# Patient Record
Sex: Male | Born: 1945 | Race: White | Hispanic: No | Marital: Married | Smoking: Never smoker
Health system: Southern US, Community
[De-identification: ages and names within clinical notes are randomized; demographics above are authoritative.]

## PROBLEM LIST (undated history)

## (undated) DIAGNOSIS — B029 Zoster without complications: Secondary | ICD-10-CM

## (undated) DIAGNOSIS — H409 Unspecified glaucoma: Secondary | ICD-10-CM

## (undated) DIAGNOSIS — M419 Scoliosis, unspecified: Secondary | ICD-10-CM

## (undated) DIAGNOSIS — C61 Malignant neoplasm of prostate: Secondary | ICD-10-CM

## (undated) DIAGNOSIS — G43909 Migraine, unspecified, not intractable, without status migrainosus: Secondary | ICD-10-CM

## (undated) HISTORY — DX: Unspecified glaucoma: H40.9

## (undated) HISTORY — DX: Migraine, unspecified, not intractable, without status migrainosus: G43.909

## (undated) HISTORY — DX: Zoster without complications: B02.9

## (undated) HISTORY — DX: Malignant neoplasm of prostate: C61

## (undated) HISTORY — DX: Scoliosis, unspecified: M41.9

## (undated) HISTORY — PX: HERNIA REPAIR: SHX51

---

## 2015-06-14 DIAGNOSIS — M41125 Adolescent idiopathic scoliosis, thoracolumbar region: Secondary | ICD-10-CM | POA: Insufficient documentation

## 2015-06-14 DIAGNOSIS — M545 Low back pain, unspecified: Secondary | ICD-10-CM | POA: Insufficient documentation

## 2015-06-14 DIAGNOSIS — M4722 Other spondylosis with radiculopathy, cervical region: Secondary | ICD-10-CM | POA: Insufficient documentation

## 2015-06-14 DIAGNOSIS — R972 Elevated prostate specific antigen [PSA]: Secondary | ICD-10-CM | POA: Insufficient documentation

## 2015-06-14 DIAGNOSIS — G5622 Lesion of ulnar nerve, left upper limb: Secondary | ICD-10-CM | POA: Insufficient documentation

## 2015-06-14 DIAGNOSIS — B009 Herpesviral infection, unspecified: Secondary | ICD-10-CM | POA: Insufficient documentation

## 2015-06-14 DIAGNOSIS — G43009 Migraine without aura, not intractable, without status migrainosus: Secondary | ICD-10-CM | POA: Insufficient documentation

## 2015-06-14 DIAGNOSIS — G8929 Other chronic pain: Secondary | ICD-10-CM | POA: Insufficient documentation

## 2016-02-20 DIAGNOSIS — Z8042 Family history of malignant neoplasm of prostate: Secondary | ICD-10-CM | POA: Insufficient documentation

## 2016-06-11 DIAGNOSIS — C61 Malignant neoplasm of prostate: Secondary | ICD-10-CM | POA: Insufficient documentation

## 2016-06-11 DIAGNOSIS — N320 Bladder-neck obstruction: Secondary | ICD-10-CM | POA: Insufficient documentation

## 2017-07-21 DIAGNOSIS — H9313 Tinnitus, bilateral: Secondary | ICD-10-CM | POA: Insufficient documentation

## 2018-04-07 ENCOUNTER — Ambulatory Visit: Payer: Medicare PPO | Admitting: Urology

## 2018-04-07 ENCOUNTER — Encounter: Payer: Self-pay | Admitting: Urology

## 2018-04-07 VITALS — BP 155/94 | HR 66 | Ht 70.0 in | Wt 160.5 lb

## 2018-04-07 DIAGNOSIS — C61 Malignant neoplasm of prostate: Secondary | ICD-10-CM

## 2018-04-07 DIAGNOSIS — Z7689 Persons encountering health services in other specified circumstances: Secondary | ICD-10-CM | POA: Diagnosis not present

## 2018-04-07 LAB — URINALYSIS, COMPLETE
Bilirubin, UA: NEGATIVE
GLUCOSE, UA: NEGATIVE
KETONES UA: NEGATIVE
Nitrite, UA: NEGATIVE
PROTEIN UA: NEGATIVE
RBC, UA: NEGATIVE
Specific Gravity, UA: 1.025 (ref 1.005–1.030)
Urobilinogen, Ur: 0.2 mg/dL (ref 0.2–1.0)
pH, UA: 6 (ref 5.0–7.5)

## 2018-04-07 LAB — MICROSCOPIC EXAMINATION
Epithelial Cells (non renal): NONE SEEN /hpf (ref 0–10)
RBC, UA: NONE SEEN /hpf (ref 0–2)

## 2018-04-07 NOTE — Progress Notes (Signed)
04/07/2018 7:32 AM   Angel Perkins Jul 14, 1945 [id number]  Referring provider: No referring provider defined for this encounter.  Chief Complaint  Patient presents with  . Establish Care  . Prostate Cancer   Urologic history: 1.  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 Jacqlyn Larsen at [facility] for follow-up of prostate cancer.  Urologic history as above.  He currently has no complaints.  He denies bothersome lower urinary tract symptoms. Denies dysuria, gross hematuria or flank/abdominal/pelvic/scrotal pain.  PSA performed 02/18/2018 was elevated above baseline at 6.63.  PMH: Past Medical History:  Diagnosis Date  . Glaucoma   . Migraines   . Prostate cancer ([place])   . Scoliosis   . Shingles     Surgical History: Past Surgical History:  Procedure Laterality Date  . HERNIA REPAIR      Home Medications:  Allergies as of 04/07/2018      Reactions   Tizanidine    Other reaction(s): Dizziness, Other (See Comments) Dry mouth      Medication List       Accurate as of April 07, 2018 11:59 PM. Always use your most recent med list.        butalbital-acetaminophen-caffeine 50-325-40-30 MG capsule Commonly known as:  FIORICET WITH CODEINE Take by mouth.   meloxicam 15 MG tablet Commonly known as:  MOBIC Take by mouth.   timolol 0.5 % ophthalmic solution Commonly known as:  TIMOPTIC INT 1 GTT INTO OU QD   valACYclovir 500 MG tablet Commonly known as:  VALTREX Take 500 mg by mouth 2 (two) times daily.   XALATAN 0.005 % ophthalmic solution Generic  drug:  latanoprost       Allergies:  Allergies  Allergen Reactions  . Tizanidine     Other reaction(s): Dizziness, Other (See Comments) Dry mouth    Family History: Family History  Problem Relation Age of Onset  . Prostate cancer Father   . Cancer Mother     Social History:  reports that he has never smoked. He has never used smokeless tobacco. He reports current alcohol use. He reports that he does not use drugs.  ROS: UROLOGY Frequent Urination?: No Hard to postpone urination?: No Burning/pain with urination?: No Get up at night to urinate?: Yes Leakage of urine?: No Urine stream starts and stops?: No Trouble starting stream?: No Do you have to strain to urinate?: No Blood in urine?: No Urinary tract infection?: No Sexually transmitted disease?: No Injury to kidneys or bladder?: No Painful intercourse?: No Weak stream?: No Erection problems?: No Penile pain?: No  Gastrointestinal Nausea?: No Vomiting?: No Indigestion/heartburn?: No Diarrhea?: No Constipation?: No  Constitutional Fever: No Night sweats?: No Weight loss?: No Fatigue?: No  Skin Skin rash/lesions?: No Itching?: No  Eyes Blurred vision?: No Double vision?: No  Ears/Nose/Throat Sore throat?: No Sinus problems?: No  Hematologic/Lymphatic Swollen glands?: No Easy bruising?: Yes  Cardiovascular Leg swelling?: No Chest pain?: No  Respiratory Cough?: No Shortness of  breath?: No  Endocrine Excessive thirst?: No  Musculoskeletal Back pain?: Yes Joint pain?: No  Neurological Headaches?: Yes Dizziness?: No  Psychologic Depression?: No Anxiety?: No  Physical Exam: BP (!) 155/94 (BP Location: Left Arm, Patient Position: Sitting, Cuff Size: Normal)   Pulse 66   Ht 5\' 10"  (1.778 m)   Wt 160 lb 8 oz (72.8 kg)   BMI 23.03 kg/m   Constitutional:  Well nourished. Alert and oriented, No acute distress. HEENT: [place] AT, moist mucus membranes.  Trachea midline, no  masses. Cardiovascular: No clubbing, cyanosis, or edema. Respiratory: Normal respiratory effort, no increased work of breathing. Rectal: Patient with  normal sphincter tone. Anus and perineum without scarring or rashes. No rectal masses are appreciated. Prostate is approximately 50 grams, smooth rectal tone, no nodules are appreciated. Seminal vesicles are normal. Skin: No rashes, bruises or suspicious lesions. Neurologic: Grossly intact, no focal deficits, moving all 4 extremities. Psychiatric: Normal mood and affect.  Urinalysis UA is negative.  Assessment & Plan:   1.  Very low risk prostate cancer -Most recent PSA elevated above baseline however fusion bx on 08/04/17 showed low grade prostate cancer.  -He desires to continue active surveillance. -He inquired if we were just waiting for his cancer to get worse.  We discussed the rationale for active surveillance and that typically low-grade prostate cancers are typically not clinically significant but are closely monitored -He was informed that if he is uncomfortable with active surveillance curative treatment could be considered including radical prostatectomy, radiation modalities and HIFU. -He has elected to continue active surveillance for now  Will repeat a PSA May 2020   Abbie Sons, MD  [facility] [address], [place], [place] [address] [phone]  I, Lucas Mallow, am acting as a scribe for Dr. Nicki Reaper C. Stoioff,  I, Abbie Sons, MD, have reviewed all documentation for this visit. The documentation on 04/08/18 for the exam, diagnosis, procedures, and orders are all accurate and complete.

## 2018-04-08 ENCOUNTER — Encounter: Payer: Self-pay | Admitting: Urology

## 2018-05-21 ENCOUNTER — Emergency Department: Payer: No Typology Code available for payment source

## 2018-05-21 ENCOUNTER — Other Ambulatory Visit: Payer: Self-pay

## 2018-05-21 ENCOUNTER — Emergency Department
Admission: EM | Admit: 2018-05-21 | Discharge: 2018-05-21 | Disposition: A | Discharge status: Home / Self Care | Payer: No Typology Code available for payment source | Attending: Emergency Medicine | Admitting: Emergency Medicine

## 2018-05-21 DIAGNOSIS — Z79899 Other long term (current) drug therapy: Secondary | ICD-10-CM | POA: Insufficient documentation

## 2018-05-21 DIAGNOSIS — Y9389 Activity, other specified: Secondary | ICD-10-CM | POA: Insufficient documentation

## 2018-05-21 DIAGNOSIS — Z8546 Personal history of malignant neoplasm of prostate: Secondary | ICD-10-CM | POA: Insufficient documentation

## 2018-05-21 DIAGNOSIS — W2210XA Striking against or struck by unspecified automobile airbag, initial encounter: Secondary | ICD-10-CM | POA: Insufficient documentation

## 2018-05-21 DIAGNOSIS — S161XXA Strain of muscle, fascia and tendon at neck level, initial encounter: Secondary | ICD-10-CM

## 2018-05-21 DIAGNOSIS — Y998 Other external cause status: Secondary | ICD-10-CM | POA: Diagnosis not present

## 2018-05-21 DIAGNOSIS — S199XXA Unspecified injury of neck, initial encounter: Secondary | ICD-10-CM | POA: Diagnosis present

## 2018-05-21 DIAGNOSIS — Y92410 Unspecified street and highway as the place of occurrence of the external cause: Secondary | ICD-10-CM | POA: Insufficient documentation

## 2018-05-21 MED ORDER — CYCLOBENZAPRINE HCL 5 MG PO TABS: 5.0000 mg | ORAL_TABLET | Freq: Three times a day (TID) | ORAL | 0 refills | Status: AC | PRN
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
## 2018-08-09 ENCOUNTER — Other Ambulatory Visit: Payer: Medicare PPO

## 2018-12-08 ENCOUNTER — Other Ambulatory Visit: Payer: Medicare PPO

## 2018-12-08 ENCOUNTER — Other Ambulatory Visit: Payer: Self-pay | Admitting: *Deleted

## 2018-12-08 ENCOUNTER — Other Ambulatory Visit: Payer: Self-pay

## 2018-12-08 DIAGNOSIS — C61 Malignant neoplasm of prostate: Secondary | ICD-10-CM

## 2018-12-09 LAB — PSA: Prostate Specific Ag, Serum: 7.9 ng/mL — ABNORMAL HIGH (ref 0.0–4.0)

## 2018-12-13 ENCOUNTER — Telehealth: Payer: Self-pay | Admitting: *Deleted

## 2018-12-13 NOTE — Telephone Encounter (Addendum)
Patient informed-verbalized understanding-virtual appt made  ----- Message from Abbie Sons, MD sent at 12/11/2018 12:25 PM EDT ----- PSA is rising and was 7.9.  Recommend telephone visit to discuss options

## 2018-12-17 ENCOUNTER — Other Ambulatory Visit: Payer: Self-pay

## 2018-12-17 ENCOUNTER — Telehealth (INDEPENDENT_AMBULATORY_CARE_PROVIDER_SITE_OTHER): Payer: Medicare PPO | Admitting: Urology

## 2018-12-17 DIAGNOSIS — C61 Malignant neoplasm of prostate: Secondary | ICD-10-CM | POA: Diagnosis not present

## 2018-12-17 NOTE — Progress Notes (Signed)
Virtual Visit via Telephone Note  I connected with Angel Perkins on 12/17/18 at 10:00 AM EDT by telephone and verified that I am speaking with the correct person using two identifiers.  Location: Patient: Home Provider: Office   I discussed the limitations, risks, security and privacy concerns of performing an evaluation and management service by telephone and the availability of in person appointments. I also discussed with the patient that there may be a patient responsible charge related to this service. The patient expressed understanding and agreed to proceed.   History of Present Illness: Mr. Steuerwald was contacted for telephone follow-up of a rising PSA.  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  He has no bothersome lower urinary tract symptoms.   Observations/Objective: Alert, conversive  Assessment and Plan: T1c very low risk prostate cancer: A follow-up MR fusion biopsy for PI-RADS 3 lesion showed focus of Gleason 3+3.  PSA has increased from 6.63 last year to 7.9.  We discussed the following options: -Repeat prostate MRI with repeat fusion biopsy for abnormalities -Repeat standard biopsy -Continued surveillance -Stopping surveillance and lieu of curative therapy  He had several questions about Gleason 3+3 adenocarcinoma and was concerned potential metastasis.  He was informed that it is unlikely low-grade prostate cancer would metastasize and the concern would be a high-grade cancer that has not been  detected on prior biopsies.  He also had questions about HIFU and was informed that it is now FDA approved and there are providers in the area who performed.  We also discussed radical prostatectomy and radiation modalities including IMRT and brachytherapy.  Follow Up Instructions: He will think over these options.  Will repeat PSA December 2020.  If he decides to pursue therapy, biopsy or MRI he will call back.   I discussed the assessment and treatment plan with the patient. The patient was provided an opportunity to ask questions and all were answered. The patient agreed with the plan and demonstrated an understanding of the instructions.   The patient was advised to call back or seek an in-person evaluation if the symptoms worsen or if the condition fails to improve as anticipated.  I provided 22 minutes of non-face-to-face time during this encounter.   Abbie Sons, MD

## 2019-03-02 ENCOUNTER — Other Ambulatory Visit: Payer: Self-pay

## 2019-03-02 DIAGNOSIS — C61 Malignant neoplasm of prostate: Secondary | ICD-10-CM

## 2019-03-03 ENCOUNTER — Other Ambulatory Visit: Payer: Self-pay

## 2019-03-03 ENCOUNTER — Other Ambulatory Visit: Payer: Medicare PPO

## 2019-03-03 DIAGNOSIS — C61 Malignant neoplasm of prostate: Secondary | ICD-10-CM

## 2019-03-04 ENCOUNTER — Telehealth: Payer: Self-pay

## 2019-03-04 LAB — PSA: Prostate Specific Ag, Serum: 8.9 ng/mL — ABNORMAL HIGH (ref 0.0–4.0)

## 2019-03-04 NOTE — Telephone Encounter (Signed)
Called pt informed him of information below. Pt gave verbal understanding. Virtual appt made. Labs mailed per pt's request.

## 2019-03-04 NOTE — Telephone Encounter (Signed)
-----   Message from Abbie Sons, MD sent at 03/04/2019  9:17 AM EST ----- PSA has increased to 8.9.  Management options were discussed at our telehealth visit September 2020.  Would be happy to discuss further with another telehealth or in person visit.

## 2019-03-09 ENCOUNTER — Other Ambulatory Visit: Payer: Self-pay

## 2019-03-09 ENCOUNTER — Telehealth (INDEPENDENT_AMBULATORY_CARE_PROVIDER_SITE_OTHER): Payer: Medicare PPO | Admitting: Urology

## 2019-03-09 DIAGNOSIS — C61 Malignant neoplasm of prostate: Secondary | ICD-10-CM | POA: Diagnosis not present

## 2019-03-10 NOTE — Progress Notes (Signed)
Virtual Visit via Telephone Note  I connected with Angel Perkins on 03/09/19 at  1:00 PM EST by telephone and verified that I am speaking with the correct person using two identifiers.  Location: Patient: Home Provider: Office   I discussed the limitations, risks, security and privacy concerns of performing an evaluation and management service by telephone and the availability of in person appointments. I also discussed with the patient that there may be a patient responsible charge related to this service. The patient expressed understanding and agreed to proceed.   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  After discussing options he elected to repeat the PSA which had increased to 8.9 on 03/03/2019.  Observations/Objective: N/A  Assessment and Plan: 73 y.o. male with very low risk prostate cancer on surveillance.  Confirmatory fusion biopsy again showed focal Gleason 3+3 adenocarcinoma.  ROI biopsy was negative.  I recommended repeating the prostate MRI. He was concerned about going into the hospital with surge of Covid cases.  This is a reasonable concern and will plan on repeating his PSA March 2021.  If still elevated above baseline will proceed with repeat MRI at that time.  Other options were discussed including standard prostate biopsy and continued surveillance.  Follow Up Instructions: Lab visit for PSA March 2021   I discussed the assessment and treatment plan with the patient. The  patient was provided an opportunity to ask questions and all were answered. The patient agreed with the plan and demonstrated an understanding of the instructions.   The patient was advised to call back or seek an in-person evaluation if the symptoms worsen or if the condition fails to improve as anticipated.  I provided 10 minutes of non-face-to-face time during this encounter.   Abbie Sons, MD

## 2019-06-02 ENCOUNTER — Other Ambulatory Visit: Payer: Medicare PPO

## 2019-06-02 ENCOUNTER — Other Ambulatory Visit: Payer: Self-pay

## 2019-06-02 DIAGNOSIS — C61 Malignant neoplasm of prostate: Secondary | ICD-10-CM

## 2019-06-03 ENCOUNTER — Telehealth: Payer: Self-pay | Admitting: Urology

## 2019-06-03 DIAGNOSIS — C61 Malignant neoplasm of prostate: Secondary | ICD-10-CM

## 2019-06-03 LAB — PSA: Prostate Specific Ag, Serum: 10.4 ng/mL — ABNORMAL HIGH (ref 0.0–4.0)

## 2019-06-03 NOTE — Telephone Encounter (Signed)
Notified patient as instructed, patient pleased. Discussed follow-up appointments, patient agrees  

## 2019-06-03 NOTE — Telephone Encounter (Signed)
Repeat PSA has increased to 10.4.  Recommend scheduling prostate MRI.  Order entered.  Will call with results

## 2019-06-09 ENCOUNTER — Other Ambulatory Visit: Payer: Medicare PPO

## 2019-06-17 ENCOUNTER — Ambulatory Visit: Payer: Medicare PPO

## 2019-08-01 ENCOUNTER — Other Ambulatory Visit: Payer: Self-pay

## 2019-08-01 ENCOUNTER — Ambulatory Visit
Admission: RE | Admit: 2019-08-01 | Discharge: 2019-08-01 | Disposition: A | Discharge status: Home / Self Care | Payer: Medicare PPO | Source: Ambulatory Visit | Attending: Urology | Admitting: Urology

## 2019-08-01 DIAGNOSIS — C61 Malignant neoplasm of prostate: Secondary | ICD-10-CM | POA: Diagnosis present

## 2019-08-01 MED ORDER — GADOBUTROL 1 MMOL/ML IV SOLN
7.0000 mL | Freq: Once | INTRAVENOUS | Status: AC | PRN
  Administered 2019-08-01: 16:00:00 7 mL via INTRAVENOUS

## 2019-08-04 ENCOUNTER — Telehealth: Payer: Self-pay | Admitting: Urology

## 2019-08-04 DIAGNOSIS — C61 Malignant neoplasm of prostate: Secondary | ICD-10-CM

## 2019-08-04 NOTE — Telephone Encounter (Signed)
I contacted Angel Perkins regarding his prostate MRI.  He has a PI-RADS 5 lesion left medial apex.  Prostate volume is 55 g.  Prior prostate cancer diagnosis very low risk.  We discussed MR fusion biopsy [place] and he would like to proceed.

## 2019-08-11 NOTE — Telephone Encounter (Signed)
Patient calling asking about scheduling his fusion biopsy in [place]? Spoke with Sharyn Lull and this in review and sometimes take 2-3 weeks to schedule and Tammy from [facility] will call to schedule when ready.  Pt also has questions for Dr. Bernardo Heater, pt asking if the biopsy comes back bad what are his options? Per pt he's had 3 biopsies and does not want to continue to get those. Pt asking what is the alternative? What would radiation do? What does it involve?

## 2019-08-11 NOTE — Telephone Encounter (Signed)
Spoke with patient and advised results about scheduling fusion biopsy  Scheduled virtual visit 08/12/2019 @11 :30

## 2019-08-12 ENCOUNTER — Telehealth (INDEPENDENT_AMBULATORY_CARE_PROVIDER_SITE_OTHER): Payer: Medicare PPO | Admitting: Urology

## 2019-08-12 ENCOUNTER — Other Ambulatory Visit: Payer: Self-pay

## 2019-08-12 NOTE — Progress Notes (Signed)
Mr. Angel Perkins recently was found to have PI-RADS 5 lesion and has been scheduled for fusion biopsy in [place].  He called yesterday and stated he had several questions.  He was scheduled for virtual visit.  I was running behind and his wife had to leave in 5 minutes and he asked that we reschedule.  The vast majority of his questions were related to prostate cancer treatments and I recommended we wait until after his biopsy before addressing these questions.  He was in agreement.

## 2019-08-23 ENCOUNTER — Telehealth (INDEPENDENT_AMBULATORY_CARE_PROVIDER_SITE_OTHER): Payer: Medicare PPO | Admitting: Urology

## 2019-08-23 ENCOUNTER — Other Ambulatory Visit: Payer: Self-pay

## 2019-08-23 ENCOUNTER — Other Ambulatory Visit: Payer: Self-pay | Admitting: Urology

## 2019-08-23 DIAGNOSIS — C61 Malignant neoplasm of prostate: Secondary | ICD-10-CM

## 2019-08-26 ENCOUNTER — Encounter: Payer: Self-pay | Admitting: Urology

## 2019-08-26 NOTE — Progress Notes (Signed)
Virtual Visit via Telephone Note  I connected with Angel Perkins on 08/24/2019 at 2:30 PM EDT by telephone and verified that I am speaking with the correct person using two identifiers.  Location: Patient: Home Provider: Private vehicle (parked)   I discussed the limitations, risks, security and privacy concerns of performing an evaluation and management service by telephone and the availability of in person appointments. I also discussed with the patient that there may be a patient responsible charge related to this service. The patient expressed understanding and agreed to proceed.   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 male with the above problem list.  Prostate MRI performed 08/01/2019 for PSA bumped to 10.4 which showed a PI-RADS 5 lesion left medial apex.  He underwent fusion biopsy in [facility] 5/21 and had no post biopsy complaints.  Pathology: ROI biopsies positive for Gleason 3+3 and 4+3 adenocarcinoma.  6/12 template biopsies positive for Gleason 3+3/4+3 and 4+5 adenocarcinoma.   Observations/Objective: N/A  Assessment and Plan: 74 y.o. male with a history of low risk prostate cancer on active surveillance with recent fusion biopsy showing high risk disease.  The pathology report was discussed in detail.  I recommended discontinuing active surveillance and definitive treatment was recommended.  MRI did not show evidence of extracapsular disease or adenopathy.  Bone scan was recommended in order placed.  If bone scan shows no  evidence of metastatic disease we discussed curative options of RALP and radiation modalities.  He has been treated on HIFU and is also exploring this option.  He wanted to think over his options and states he will call back with any questions and with his decision.  I did offer him an appointment in radiation oncology.  Follow Up Instructions:    I discussed the assessment and treatment plan with the patient. The patient was provided an opportunity to ask questions and all were answered. The patient agreed with the plan and demonstrated an understanding of the instructions.   The patient was advised to call back or seek an in-person evaluation if the symptoms worsen or if the condition fails to improve as anticipated.  I provided 16 minutes of non-face-to-face time during this encounter.   Abbie Sons, MD

## 2019-09-12 ENCOUNTER — Telehealth: Payer: Self-pay

## 2019-09-13 NOTE — Telephone Encounter (Signed)
Patient called requesting information about his pathology report and gleason number. Patient was given information verbally and requested a copy to be mailed to him. Report was printed and mailed

## 2019-09-22 ENCOUNTER — Other Ambulatory Visit: Payer: Self-pay

## 2019-09-22 ENCOUNTER — Other Ambulatory Visit: Payer: Medicare PPO

## 2019-09-22 ENCOUNTER — Encounter
Admission: RE | Admit: 2019-09-22 | Discharge: 2019-09-22 | Disposition: A | Discharge status: Home / Self Care | Payer: Medicare PPO | Source: Ambulatory Visit | Attending: Urology | Admitting: Urology

## 2019-09-22 DIAGNOSIS — C61 Malignant neoplasm of prostate: Secondary | ICD-10-CM | POA: Insufficient documentation

## 2019-09-22 MED ORDER — TECHNETIUM TC 99M MEDRONATE IV KIT
20.0000 | PACK | Freq: Once | INTRAVENOUS | Status: AC | PRN
  Administered 2019-09-22: 21.236 via INTRAVENOUS

## 2019-09-26 ENCOUNTER — Telehealth: Payer: Self-pay | Admitting: Urology

## 2019-09-26 NOTE — Telephone Encounter (Signed)
Bone scan showed no evidence of metastatic disease.  We had discussed curative treatment options at the last telehealth visit including radical prostatectomy and radiation therapy.  He was going to think these over and also do some research on HIFU.  Please ask if he has thought any more about treatment

## 2019-09-26 NOTE — Telephone Encounter (Signed)
Notified patient of bone scan results. Patient decided on HIFU that will take place on July 20th in [place], [place] at a facility called [facility].

## 2019-09-30 ENCOUNTER — Other Ambulatory Visit: Payer: Self-pay | Admitting: Urology

## 2019-09-30 ENCOUNTER — Ambulatory Visit
Admission: RE | Admit: 2019-09-30 | Discharge: 2019-09-30 | Disposition: A | Discharge status: Home / Self Care | Payer: Medicare PPO | Source: Ambulatory Visit | Attending: Urology | Admitting: Urology

## 2019-09-30 ENCOUNTER — Ambulatory Visit
Admission: RE | Admit: 2019-09-30 | Discharge: 2019-09-30 | Disposition: A | Discharge status: Home / Self Care | Payer: Medicare PPO | Attending: Urology | Admitting: Urology

## 2019-09-30 ENCOUNTER — Other Ambulatory Visit: Payer: Self-pay

## 2019-09-30 DIAGNOSIS — Z01818 Encounter for other preprocedural examination: Secondary | ICD-10-CM

## 2019-10-04 ENCOUNTER — Other Ambulatory Visit: Payer: Self-pay | Admitting: Urology

## 2019-10-21 ENCOUNTER — Telehealth: Payer: Self-pay

## 2019-10-21 NOTE — Telephone Encounter (Signed)
Patient called had catheter placed in [place]. His provider stated he can remove catheter himself. Patient was wondering if he is having trouble urinating if we would be able to help him. I advised him if he removes his catheter early in the morning to drink a lot of fluids and to call us by noon/ 1 pm the latest. Pt verbalized understanding.

## 2019-10-27 ENCOUNTER — Telehealth: Payer: Self-pay

## 2019-10-27 NOTE — Telephone Encounter (Signed)
Patient called stating that he removed his catheter with out difficulty and is urinating fine on his own

## 2020-05-28 ENCOUNTER — Other Ambulatory Visit: Payer: Self-pay

## 2020-11-25 IMAGING — NM NM BONE WHOLE BODY
2 series · 10 of 10 positions shown · non-contrast
Comparison: None.

CLINICAL DATA: Prostate carcinoma.

EXAM:
NUCLEAR MEDICINE WHOLE BODY BONE SCAN
TECHNIQUE: Whole body anterior and posterior images were obtained approximately
3 hours after intravenous injection of radiopharmaceutical.
RADIOPHARMACEUTICALS:  21 mCi Jechnetium-PPm MDP IV

[Series 1000: 3 hr wholebody · 2.40mm/px · 2 of 2 frames shown]
[frame 1/2]
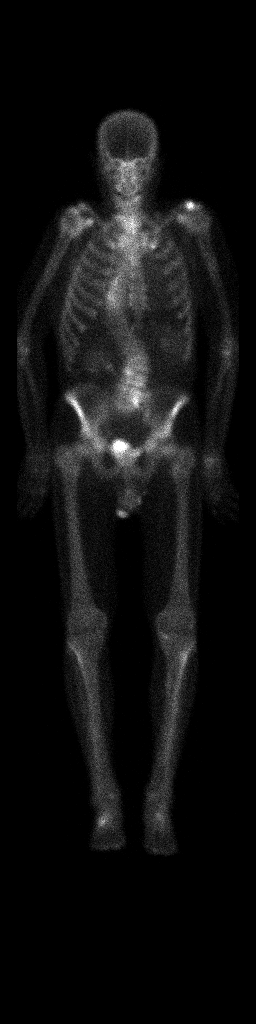
[frame 2/2]
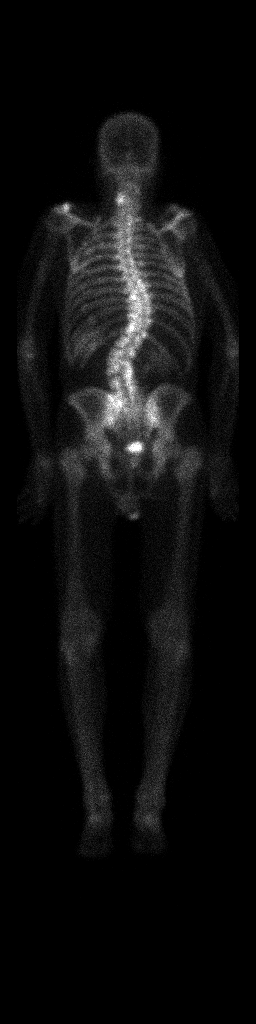

[Series 1000: statics · 2.40mm/px · 4 acquisitions, 8 frames shown]
[im 1/4]
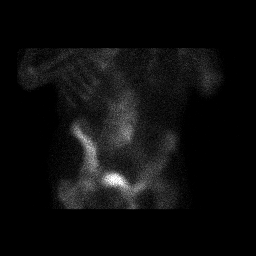
[im 1/4]
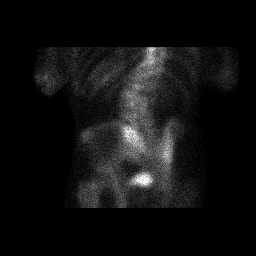
[im 2/4]
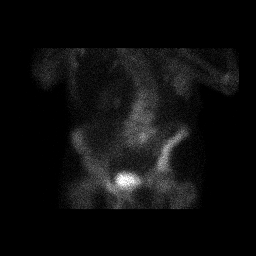
[im 2/4]
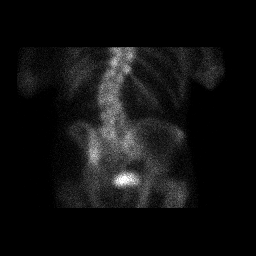
[im 3/4]
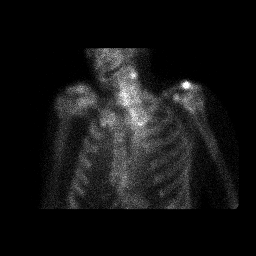
[im 3/4]
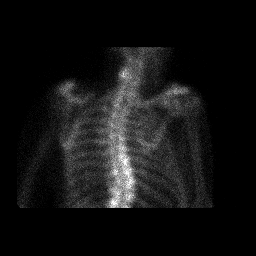
[im 4/4]
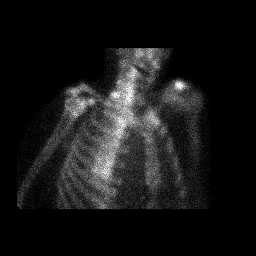
[im 4/4]
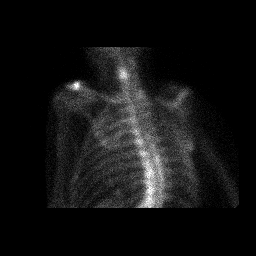

[10 of 10 positions shown; findings below may reference images not displayed]

FINDINGS: No evidence to suggest skeletal metastatic disease. Significant
rightward convex scoliosis of the mid to lower thoracic spine with
compensatory lumbar curvature. Degenerative uptake noted in the
cervical spine, bilateral shoulders, thoracic spine, lumbar spine,
sacroiliac joints, left knee and bilateral feet.
IMPRESSION: No evidence of skeletal metastatic disease by nuclear medicine
scintigraphy.

## 2020-12-03 IMAGING — CR DG CHEST 2V
2 series · 2 of 2 positions shown · non-contrast
Comparison: None.

CLINICAL DATA: Pre-procedural examination

EXAM:
CHEST - 2 VIEW

[chest pa]
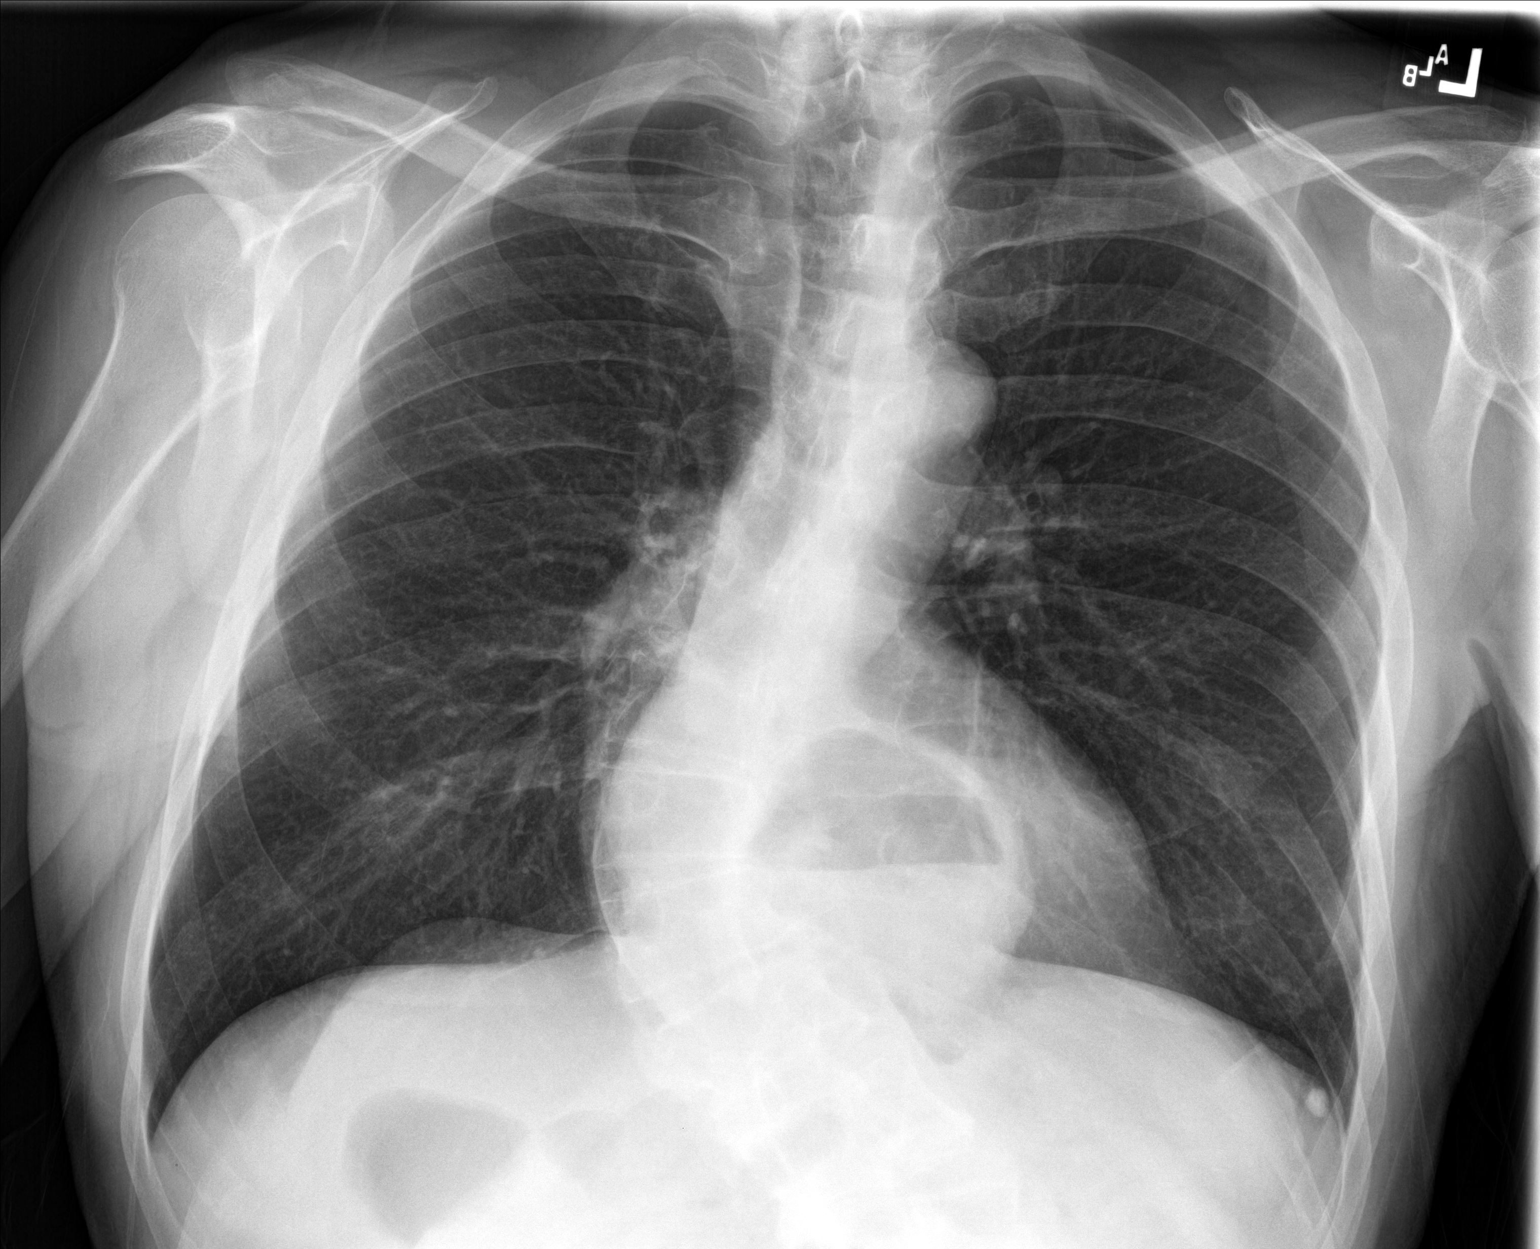

[chest lat]
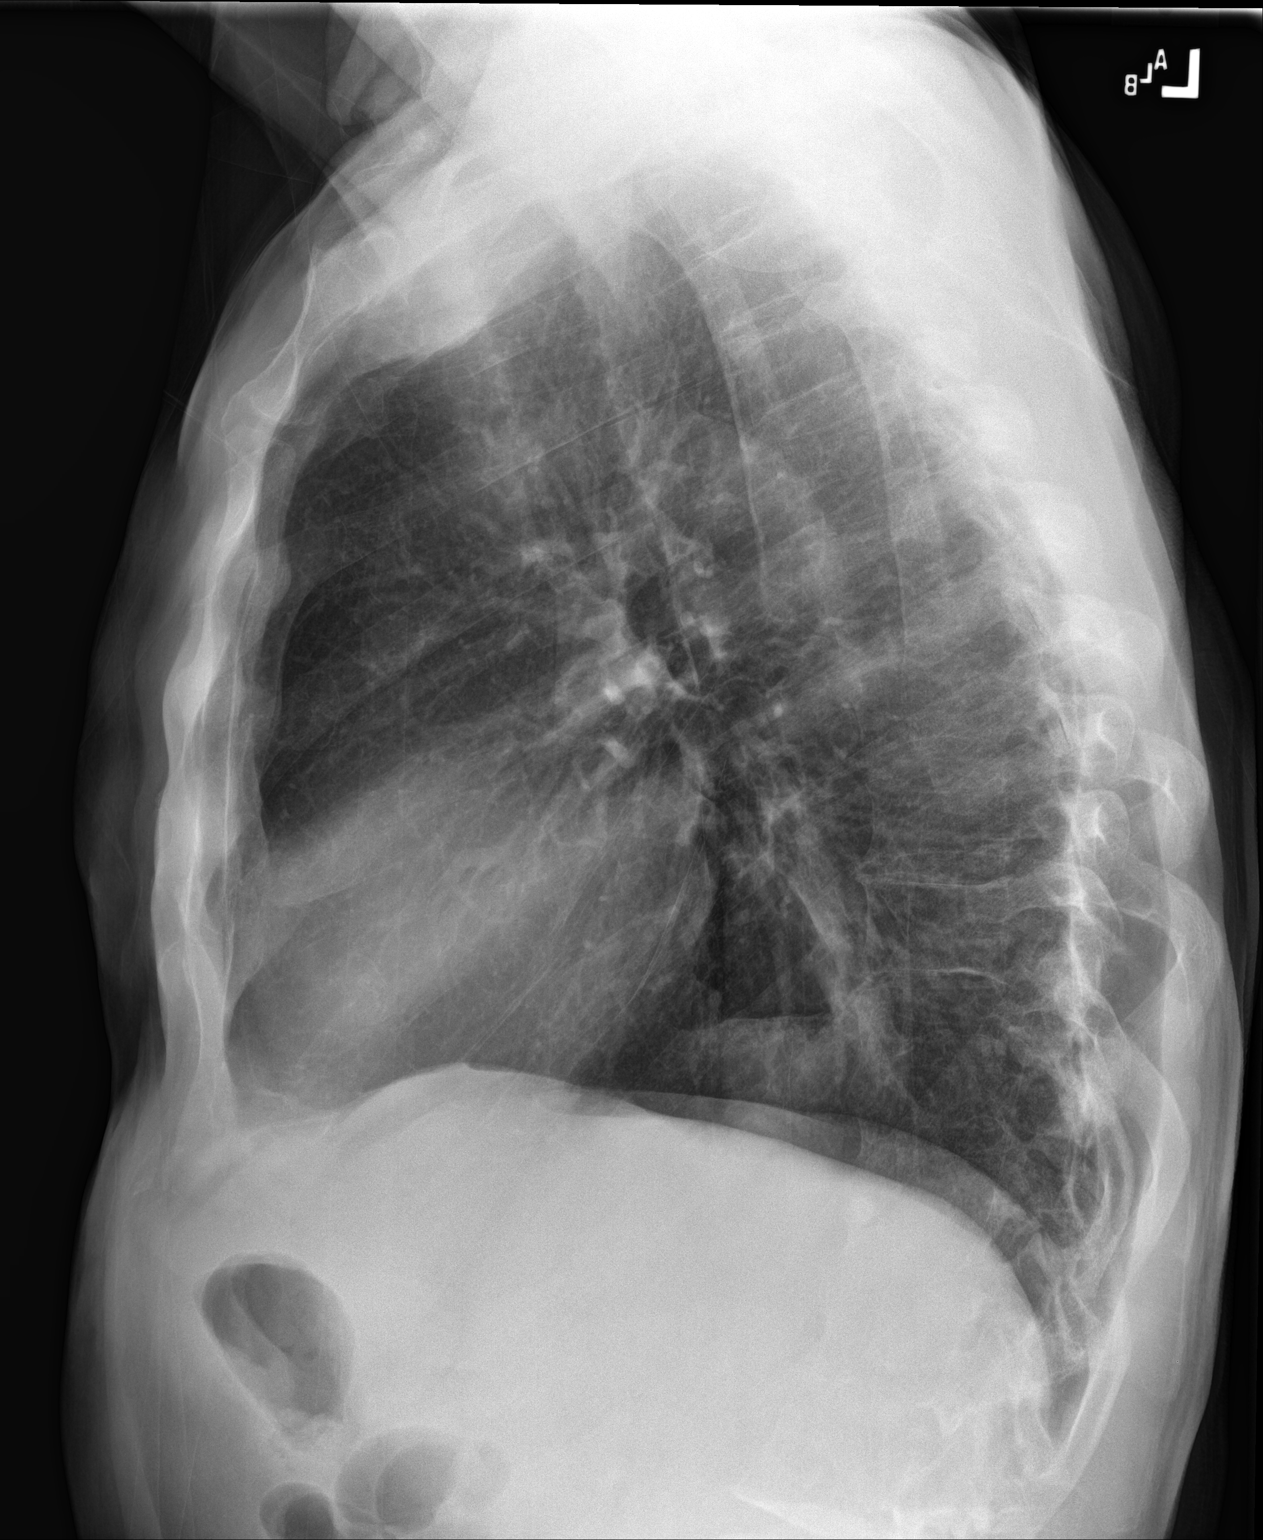

[2 of 2 positions shown; findings below may reference images not displayed]

FINDINGS: Heart size is normal. Moderate retrocardiac hiatal hernia is
present. Lungs are free of focal consolidations and pleural
effusions. Small granuloma identified at the LEFT lung base. No
pulmonary edema. No pleural effusions. Moderate convex RIGHT
scoliosis of the LOWER thoracic spine.
IMPRESSION: No active cardiopulmonary disease.
